# Patient Record
Sex: Male | Born: 1995 | Race: Black or African American | Hispanic: No | Marital: Single | Smoking: Current every day smoker
Health system: Southern US, Community
[De-identification: ages and names within clinical notes are randomized; demographics above are authoritative.]

---
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
## 2016-05-05 ENCOUNTER — Encounter (HOSPITAL_COMMUNITY): Payer: Self-pay | Admitting: Emergency Medicine

## 2016-05-05 ENCOUNTER — Emergency Department (HOSPITAL_COMMUNITY): Payer: Self-pay

## 2016-05-05 ENCOUNTER — Emergency Department (HOSPITAL_COMMUNITY)
Admission: EM | Admit: 2016-05-05 | Discharge: 2016-05-05 | Disposition: A | Discharge status: Home / Self Care | Payer: Self-pay | Attending: Emergency Medicine | Admitting: Emergency Medicine

## 2016-05-05 DIAGNOSIS — F17200 Nicotine dependence, unspecified, uncomplicated: Secondary | ICD-10-CM | POA: Insufficient documentation

## 2016-05-05 DIAGNOSIS — R1032 Left lower quadrant pain: Secondary | ICD-10-CM | POA: Insufficient documentation

## 2016-05-05 LAB — COMPREHENSIVE METABOLIC PANEL
ALK PHOS: 64 U/L (ref 38–126)
ALT: 14 U/L — AB (ref 17–63)
AST: 23 U/L (ref 15–41)
Albumin: 4.7 g/dL (ref 3.5–5.0)
Anion gap: 6 (ref 5–15)
BUN: 11 mg/dL (ref 6–20)
CALCIUM: 9.8 mg/dL (ref 8.9–10.3)
CHLORIDE: 108 mmol/L (ref 101–111)
CO2: 24 mmol/L (ref 22–32)
CREATININE: 1.08 mg/dL (ref 0.61–1.24)
GFR calc Af Amer: >60 mL/min (ref >60)
Glucose, Bld: 107 mg/dL — ABNORMAL HIGH (ref 65–99)
Potassium: 4.4 mmol/L (ref 3.5–5.1)
Sodium: 138 mmol/L (ref 135–145)
Total Bilirubin: 0.8 mg/dL (ref 0.3–1.2)
Total Protein: 7.4 g/dL (ref 6.5–8.1)

## 2016-05-05 LAB — CBC WITH DIFFERENTIAL/PLATELET
BASOS ABS: 0 10*3/uL (ref 0.0–0.1)
BASOS PCT: 0 %
EOS ABS: 0.1 10*3/uL (ref 0.0–0.7)
Eosinophils Relative: 2 %
HEMATOCRIT: 42.9 % (ref 39.0–52.0)
HEMOGLOBIN: 15 g/dL (ref 13.0–17.0)
Lymphocytes Relative: 35 %
Lymphs Abs: 2.1 10*3/uL (ref 0.7–4.0)
MCH: 30.7 pg (ref 26.0–34.0)
MCHC: 35 g/dL (ref 30.0–36.0)
MCV: 87.7 fL (ref 78.0–100.0)
Monocytes Absolute: 0.6 10*3/uL (ref 0.1–1.0)
Monocytes Relative: 10 %
NEUTROS ABS: 3.2 10*3/uL (ref 1.7–7.7)
NEUTROS PCT: 53 %
PLATELETS: 196 10*3/uL (ref 150–400)
RBC: 4.89 MIL/uL (ref 4.22–5.81)
RDW: 11.7 % (ref 11.5–15.5)
WBC: 6 10*3/uL (ref 4.0–10.5)

## 2016-05-05 LAB — URINALYSIS, ROUTINE W REFLEX MICROSCOPIC
Bilirubin Urine: NEGATIVE
Glucose, UA: NEGATIVE mg/dL
KETONES UR: NEGATIVE mg/dL
Leukocytes, UA: NEGATIVE
Nitrite: NEGATIVE
PH: 7 (ref 5.0–8.0)
PROTEIN: NEGATIVE mg/dL
Specific Gravity, Urine: 1.018 (ref 1.005–1.030)

## 2016-05-05 NOTE — ED Triage Notes (Signed)
Pt c/o LLQ pain onset this am,.  Denies nausea or vomiting.  St's 2 loose stools today.  Pt denies any urinary symptoms

## 2016-05-05 NOTE — Discharge Instructions (Addendum)
Please follow-up with referred urologist regarding findings in your urine. Call their office to be seen either this week or next week.  Return to the Emergency Dept for any sudden fevers, pain, nausea/vomiting, or any other worsening or concerning symptoms.   If you do not have a primary care doctor you see regularly, please you the list below. Please call them to arrange for follow-up.    No Primary Care Doctor Call [facility]  [phone] Other agencies that provide inexpensive medical care    [facility]  [phone]    [facility]  [phone]    [facility]  [phone]    [facility]  [phone]    [facility]  [phone]    [facility]  [phone]

## 2016-05-05 NOTE — ED Provider Notes (Signed)
[facility]-EMERGENCY DEPT Provider Note   CSN: [id number] Arrival date & time: 05/05/16  1503     History   Chief Complaint Chief Complaint  Patient presents with  . Abdominal Pain    HPI Paul Estrada is a 21 y.o. male who presents with LLQ abdominal pain that began this morning. He states that pain wrapped around to left flank and back. He reports initially feeling nauseous. When he came to ED pain intensified and reports having difficulty finding a comfortable position. He had two episodes of NBNB vomiting in the ED waiting room, which helped relieve his pain. On ED room arrival, patient reports that his pain has resolved and he is no longer feeling nauseous. His last bm was this morning and was more loose than normal, but no signs of blood. He denies eating any abnormal foods. He denies any fevers, dysuria, hematuria.   The history is provided by the patient.    History reviewed. No pertinent past medical history.  There are no active problems to display for this patient.   History reviewed. No pertinent surgical history.     Home Medications    Prior to Admission medications   Not on File    Family History No family history on file.  Social History Social History  Substance Use Topics  . Smoking status: Current Every Day Smoker  . Smokeless tobacco: Never Used  . Alcohol use Yes     Allergies   Patient has no allergy information on record.   Review of Systems Review of Systems  Constitutional: Negative for fever.  Respiratory: Negative for shortness of breath.   Cardiovascular: Negative for chest pain.  Gastrointestinal: Positive for abdominal pain, nausea and vomiting. Negative for blood in stool, constipation and diarrhea.  Genitourinary: Positive for flank pain. Negative for discharge, dysuria, hematuria, penile pain and testicular pain.  Musculoskeletal: Negative for back pain.  Neurological: Negative for dizziness and weakness.  All other systems  reviewed and are negative.    Physical Exam Updated Vital Signs BP 140/68 (BP Location: Right Arm)   Pulse 66   Temp 97.3 F (36.3 C) (Oral)   Resp 16   Ht  (1.727 m)   Wt 63.5 kg   SpO2 100%   BMI 21.29 kg/m   Physical Exam  Constitutional: He appears well-developed and well-nourished.  Sitting comfortably in bed  HENT:  Head: Normocephalic and atraumatic.  Mouth/Throat: Oropharynx is clear and moist and mucous membranes are normal.  Eyes: Conjunctivae and EOM are normal. Pupils are equal, round, and reactive to light. Right eye exhibits no discharge. Left eye exhibits no discharge. No scleral icterus.  Cardiovascular: Normal rate, regular rhythm and intact distal pulses.   Pulmonary/Chest: Effort normal and breath sounds normal.  Abdominal: Soft. Normal appearance and bowel sounds are normal. There is no tenderness. There is CVA tenderness (left). There is no rigidity, no guarding and no tenderness at McBurney's point. Hernia confirmed negative in the right inguinal area and confirmed negative in the left inguinal area.  Genitourinary: Penis normal. Right testis shows no swelling and no tenderness. Right testis is descended. Left testis shows no swelling and no tenderness. Left testis is descended. Circumcised. No penile tenderness. No discharge found.  Genitourinary Comments: The exam was performed with a chaperone present.  Musculoskeletal: He exhibits no deformity.  Neurological: He is alert.  Skin: Skin is warm and dry.  Psychiatric: He has a normal mood and affect. His speech is normal and behavior  is normal.     ED Treatments / Results  Labs (all labs ordered are listed, but only abnormal results are displayed) Labs Reviewed  COMPREHENSIVE METABOLIC PANEL - Abnormal; Notable for the following:       Result Value   Glucose, Bld 107 (*)    ALT 14 (*)    All other components within normal limits  URINALYSIS, ROUTINE W REFLEX MICROSCOPIC - Abnormal; Notable for  the following:    Hgb urine dipstick LARGE (*)    Bacteria, UA RARE (*)    Squamous Epithelial / LPF 0-5 (*)    All other components within normal limits  CBC WITH DIFFERENTIAL/PLATELET    EKG  EKG Interpretation None       Radiology US Renal  Result Date: 05/05/2016 CLINICAL DATA:  Left lower quadrant pain for 1 day. EXAM: RENAL / URINARY TRACT ULTRASOUND COMPLETE COMPARISON:  None. FINDINGS: Right Kidney: Length: 9.7 cm. Echogenicity within normal limits. No mass or hydronephrosis visualized. Left Kidney: Length: 10.5 cm. Echogenicity within normal limits. No mass or hydronephrosis visualized. Bladder: Appears normal for degree of bladder distention. IMPRESSION: Normal study.  No evidence of hydronephrosis. Electronically Signed   By: Myles Rosenthal M.D.   On: 05/05/2016 20:03    Procedures Procedures (including critical care time)  Medications Ordered in ED Medications - No data to display   Initial Impression / Assessment and Plan / ED Course  I have reviewed the triage vital signs and the nursing notes.  Pertinent labs & imaging results that were available during my care of the patient were reviewed by me and considered in my medical decision making (see chart for details).    21 yo M presents with LLQ abdominal pain that radiated to left side and flank that began this morning. Associated with nausea/vomiting. Very uncomfortable in the ED waiting room. On ED room arrival, patient reports pain has resolved. Physical exam with benign abdomen exam. No tenderness. No McBurney's point tenderness. Given history/physical exam, suspicious for kidney stone that passed while in the waiting room. Also consider viral gastroenteritis. Very low suspicion for an acute infectious process such as appendicitis or diverticulitis given exam. No indication for CT abd/pelvis at this time. Labs and urine ordered in triage. Labs reviewed. CBC with WBC within normal limits. UA positive for large Hgb which  is suspicious for kidney stone.   Reviewed labs with patient. Discussed with patient regarding imaging. Explained that while a CT is not warranted at this time, we can do a renal US to eval the kidneys or ureters for any signs of stone. Patient would like to have the [place] for reassurance. Will plan to PO trial patient while waiting for the [place]. Anti-emetics and analgesics offered but patient declined.   8:14 PM: Ultrasound results reviewed. No evidence of hydronephrosis. Reviewed imaging with patient.  Re-eval: Patient reports feeling improved. Reports 1/10 LLQ abdominal pain currently. Repeat abdomen exam no tenderness, rigidity or guarding. Patient is stable for discharge at this time. Provided patient with a list of clinic resources to use if he does not have a PCP. Instructed to call them today to arrange follow-up in the next 24-48 hours. Also provided with referral to urologist and instructed him to contact for any other concerning symptoms. Return precautions discussed. Patient expresses understanding and agreement to plan.    Final Clinical Impressions(s) / ED Diagnoses   Final diagnoses:  Left lower quadrant pain    New Prescriptions New Prescriptions   No  medications on file     Maxwell Caul, PA-C 05/05/16 2024    Maxwell Caul, PA-C 05/05/16 2024    Doug Sou, MD 05/05/16 2036

## 2016-05-05 NOTE — ED Provider Notes (Signed)
Complains of left flank pain onset this morning. Pain caused him to pace back and forth. Associated symptoms include nausea. Pain resolved spontaneously while waiting in the waiting room here, without treatment. He is presently asymptomatic on exam alert no distress lungs clear auscultation heart regular rate and rhythm abdomen nondistended nontender. No flank tenderness. Genitalia normal male. Suspect ureteral colic   Doug Sou, MD 40/98/11 1840

## 2018-09-21 IMAGING — US US RENAL
1 series · 14 of 25 positions shown · non-contrast
Comparison: None.

CLINICAL DATA: Left lower quadrant pain for 1 day.

EXAM:
RENAL / URINARY TRACT ULTRASOUND COMPLETE

[Series 1: us renal · 0.18mm/px · 14 of 27 slices shown]
[im 1/27]
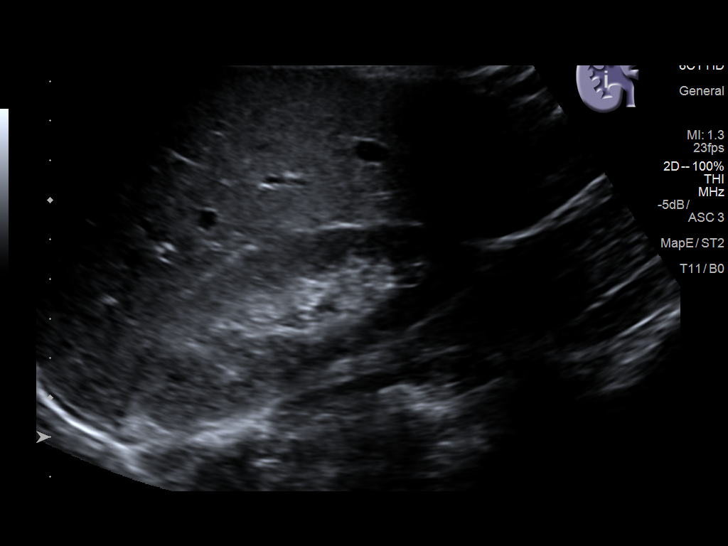
[im 3/27]
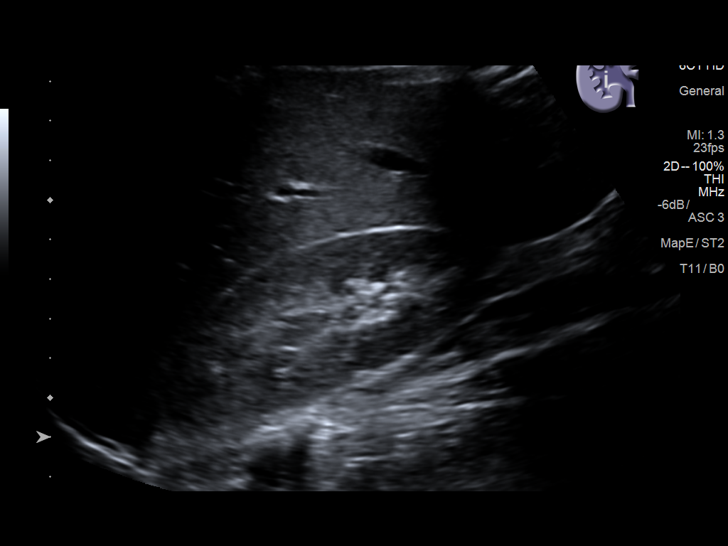
[im 5/27]
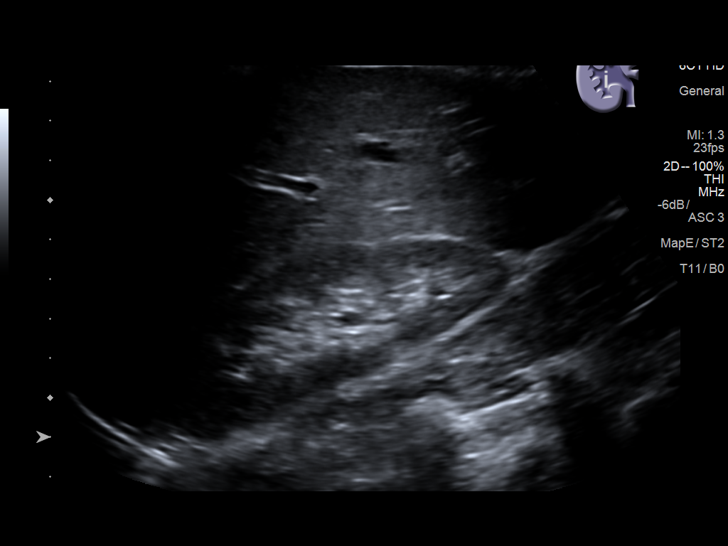
[im 7/27]
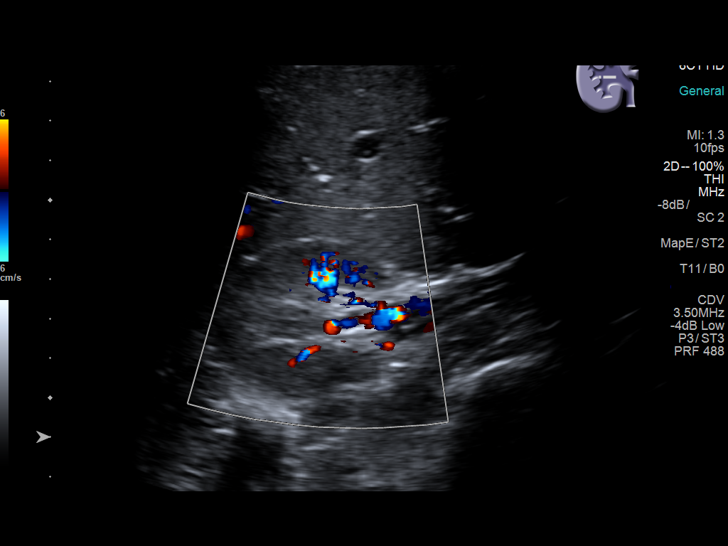
[im 9/27]
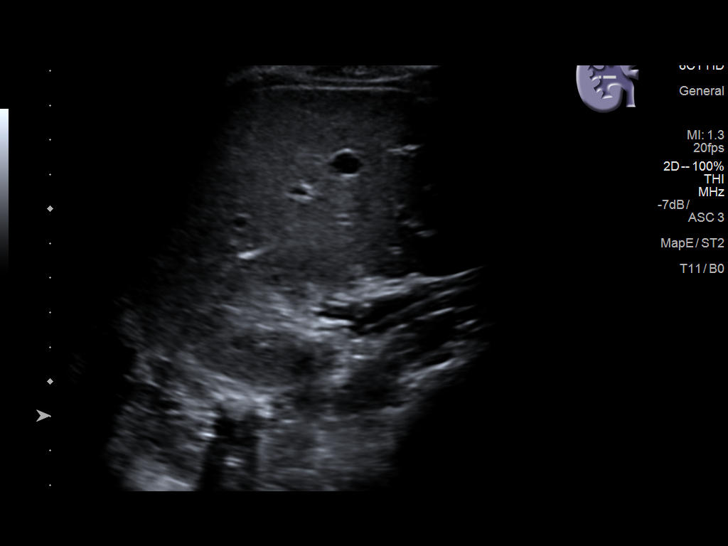
[im 10/27]
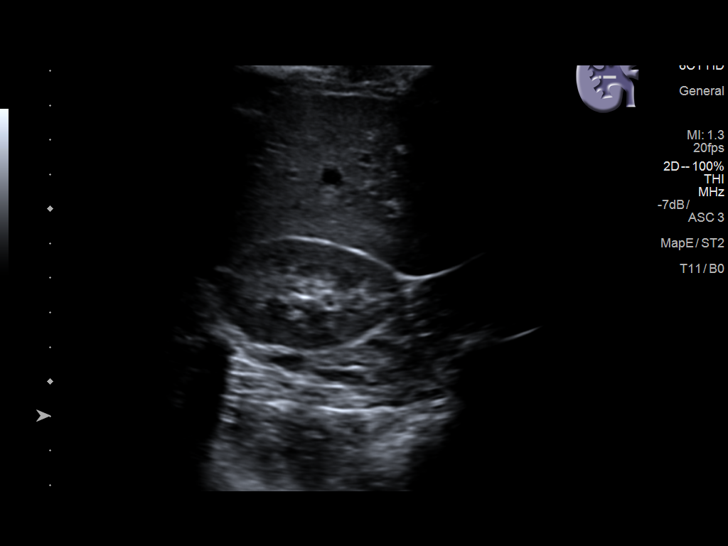
[im 12/27]
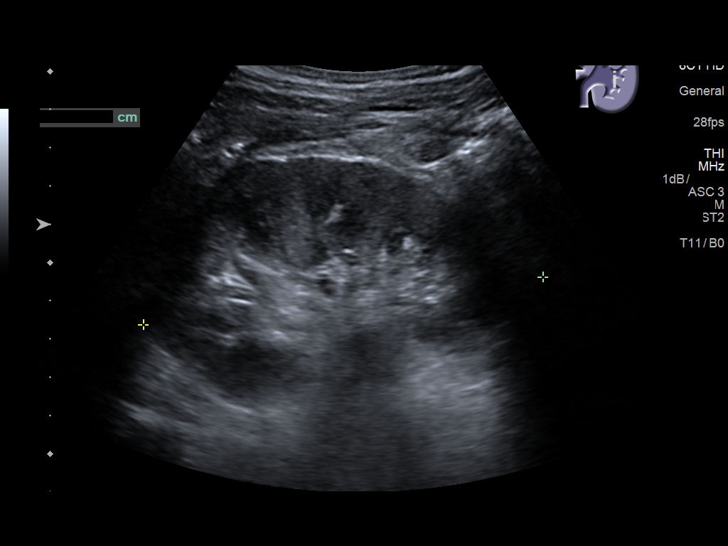
[im 15/27]
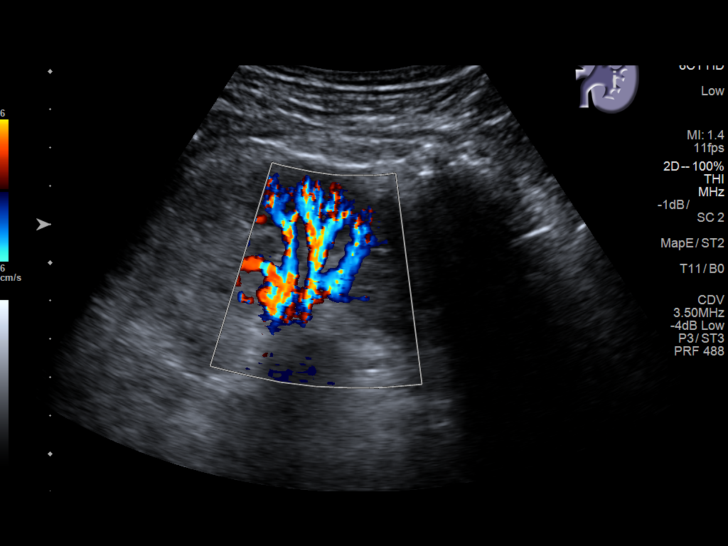
[im 17/27]
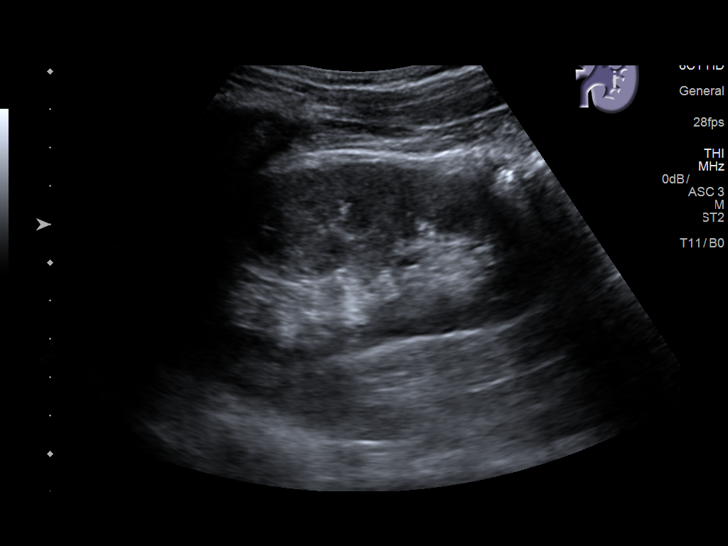
[im 18/27]
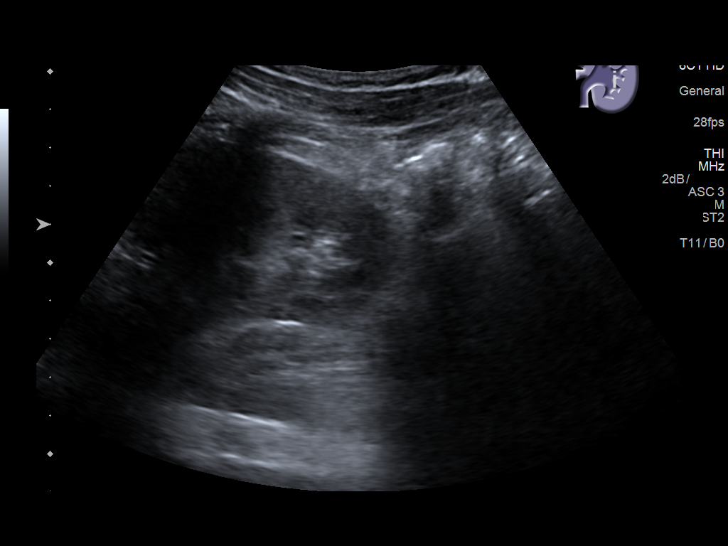
[im 20/27]
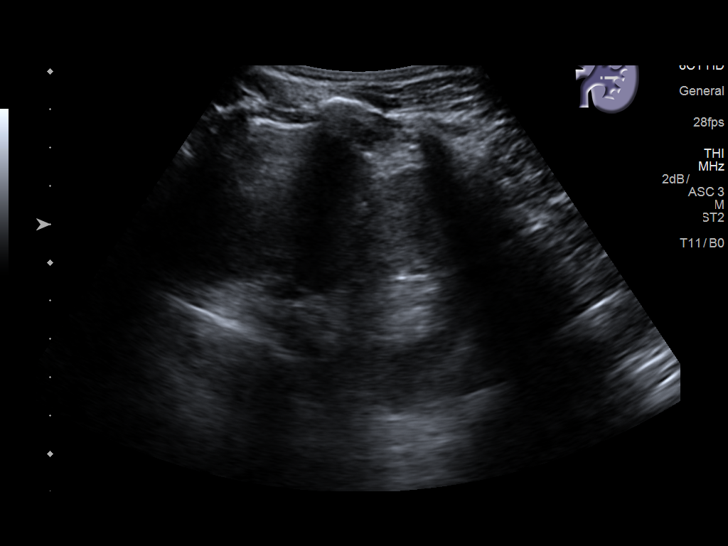
[im 22/27]
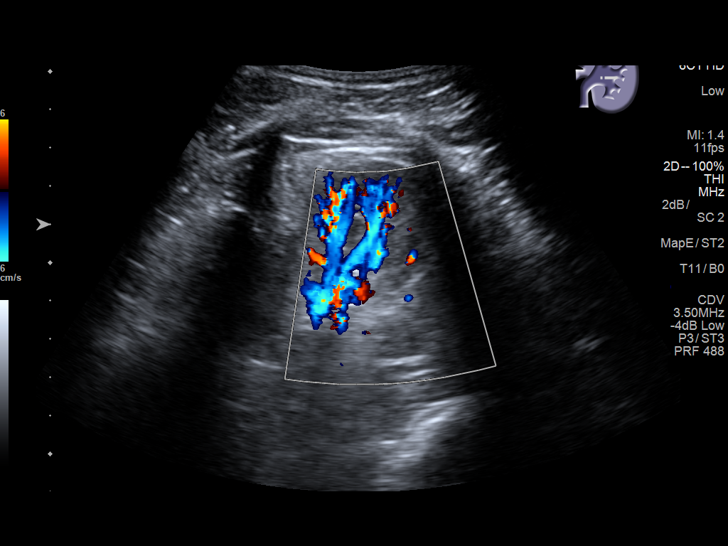
[im 24/27]
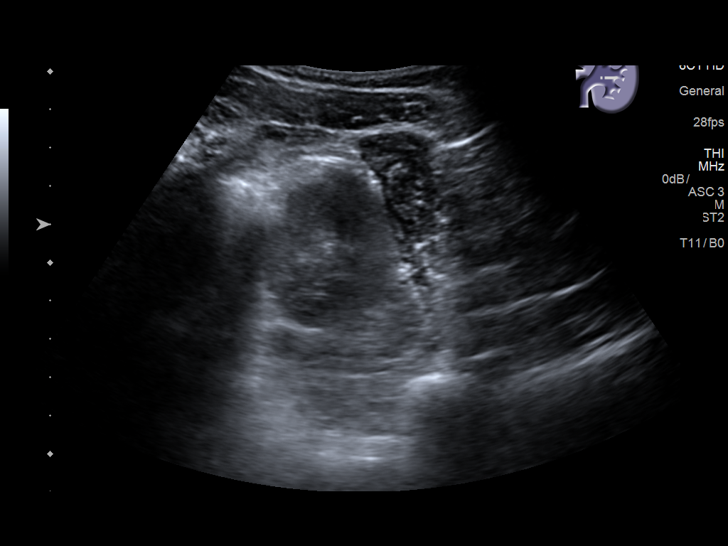
[im 27/27]
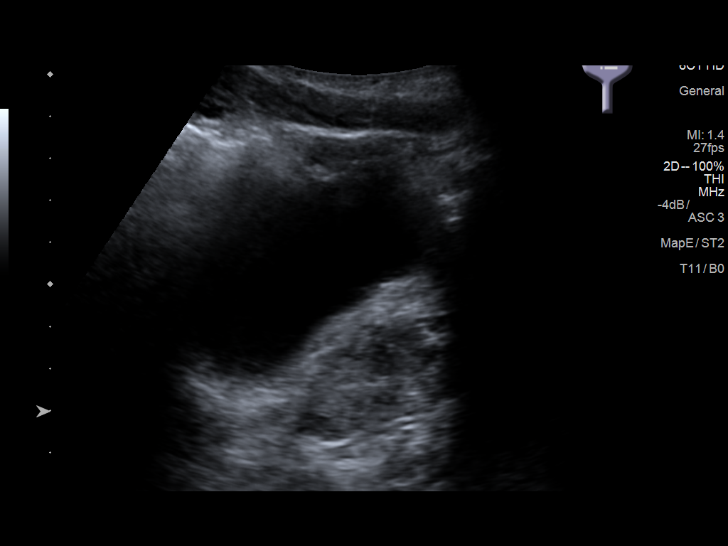

[14 of 25 positions shown; findings below may reference images not displayed]

FINDINGS: Right Kidney:

Length: 9.7 cm. Echogenicity within normal limits. No mass or
hydronephrosis visualized.

Left Kidney:

Length: 10.5 cm. Echogenicity within normal limits. No mass or
hydronephrosis visualized.

Bladder:

Appears normal for degree of bladder distention.
IMPRESSION: Normal study.  No evidence of hydronephrosis.
